# Patient Record
Sex: Female | Born: 2005 | Race: White | Hispanic: No | Marital: Single | Smoking: Never smoker
Health system: Southern US, Community
[De-identification: ages and names within clinical notes are randomized; demographics above are authoritative.]

## PROBLEM LIST (undated history)

## (undated) DIAGNOSIS — J309 Allergic rhinitis, unspecified: Secondary | ICD-10-CM

## (undated) DIAGNOSIS — F32A Depression, unspecified: Secondary | ICD-10-CM

## (undated) DIAGNOSIS — N944 Primary dysmenorrhea: Secondary | ICD-10-CM

## (undated) DIAGNOSIS — F329 Major depressive disorder, single episode, unspecified: Secondary | ICD-10-CM

## (undated) DIAGNOSIS — F99 Mental disorder, not otherwise specified: Secondary | ICD-10-CM

## (undated) DIAGNOSIS — F909 Attention-deficit hyperactivity disorder, unspecified type: Secondary | ICD-10-CM

## (undated) DIAGNOSIS — F419 Anxiety disorder, unspecified: Secondary | ICD-10-CM

## (undated) DIAGNOSIS — J4530 Mild persistent asthma, uncomplicated: Secondary | ICD-10-CM

## (undated) HISTORY — DX: Attention-deficit hyperactivity disorder, unspecified type: F90.9

## (undated) HISTORY — DX: Depression, unspecified: F32.A

## (undated) HISTORY — DX: Anxiety disorder, unspecified: F41.9

## (undated) HISTORY — PX: ADENOIDECTOMY: SUR15

## (undated) HISTORY — DX: Mild persistent asthma, uncomplicated: J45.30

## (undated) HISTORY — DX: Major depressive disorder, single episode, unspecified: F32.9

## (undated) HISTORY — DX: Mental disorder, not otherwise specified: F99

## (undated) HISTORY — DX: Primary dysmenorrhea: N94.4

## (undated) HISTORY — DX: Allergic rhinitis, unspecified: J30.9

---
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
## 2024-03-27 ENCOUNTER — Encounter (HOSPITAL_BASED_OUTPATIENT_CLINIC_OR_DEPARTMENT_OTHER): Payer: Self-pay | Admitting: Emergency Medicine

## 2024-03-27 ENCOUNTER — Ambulatory Visit: Payer: Self-pay | Admitting: *Deleted

## 2024-03-27 ENCOUNTER — Other Ambulatory Visit: Payer: Self-pay

## 2024-03-27 ENCOUNTER — Emergency Department (HOSPITAL_BASED_OUTPATIENT_CLINIC_OR_DEPARTMENT_OTHER)
Admission: EM | Admit: 2024-03-27 | Discharge: 2024-03-27 | Disposition: A | Discharge status: Home / Self Care | Attending: Emergency Medicine | Admitting: Emergency Medicine

## 2024-03-27 DIAGNOSIS — X101XXA Contact with hot food, initial encounter: Secondary | ICD-10-CM | POA: Diagnosis not present

## 2024-03-27 DIAGNOSIS — T22211A Burn of second degree of right forearm, initial encounter: Secondary | ICD-10-CM | POA: Diagnosis not present

## 2024-03-27 DIAGNOSIS — J45909 Unspecified asthma, uncomplicated: Secondary | ICD-10-CM | POA: Diagnosis not present

## 2024-03-27 DIAGNOSIS — T22111A Burn of first degree of right forearm, initial encounter: Secondary | ICD-10-CM | POA: Diagnosis present

## 2024-03-27 DIAGNOSIS — T310 Burns involving less than 10% of body surface: Secondary | ICD-10-CM | POA: Insufficient documentation

## 2024-03-27 DIAGNOSIS — T300 Burn of unspecified body region, unspecified degree: Secondary | ICD-10-CM

## 2024-03-27 MED ORDER — LIDOCAINE VISCOUS HCL 2 % MT SOLN
15.0000 mL | Freq: Once | OROMUCOSAL | Status: AC
  Administered 2024-03-27: 15 mL via TOPICAL

## 2024-03-27 MED ORDER — LIDOCAINE VISCOUS HCL 2 % MT SOLN
15.0000 mL | Freq: Once | OROMUCOSAL | Status: DC
  Filled 2024-03-27: qty 15

## 2024-03-27 MED ORDER — BACITRACIN ZINC 500 UNIT/GM EX OINT: 1.0000 | TOPICAL_OINTMENT | Freq: Two times a day (BID) | CUTANEOUS | 0 refills | Status: DC

## 2024-03-27 MED ORDER — LIDOCAINE 5 % EX OINT: 1.0000 | TOPICAL_OINTMENT | CUTANEOUS | 0 refills | Status: DC | PRN
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
## 2024-03-27 NOTE — ED Notes (Signed)
 Discharge paperwork given and verbally understood.

## 2024-03-27 NOTE — Discharge Instructions (Addendum)
 As discussed, I have sent in numbing ointment to place over the area.  If the area opens up, recommend putting antibiotic ointment on the area for bacitracin.  Recommend follow-up with your primary care for reassessment.  Please do not hesitate to return if the worrisome signs and symptoms we discussed become apparent.

## 2024-03-27 NOTE — Telephone Encounter (Signed)
 ED recommended. Noted. LS
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
## 2024-04-20 ENCOUNTER — Ambulatory Visit: Payer: Self-pay

## 2024-04-20 NOTE — Telephone Encounter (Signed)
Noted  -LS

## 2024-04-20 NOTE — Telephone Encounter (Signed)
 FYI Only or Action Required?: FYI only for provider  Patient was last seen in primary care on 12/25/2023 by Eliodoro Guerin, DO. Called Nurse Triage reporting Sore Throat and Cough. Symptoms began several days ago. Interventions attempted: Prescription medications: Zyrtec . Symptoms are: cough, sore throat, bilateral ear aches, runny nosegradually worsening.  Triage Disposition: See Physician Within 24 Hours  Patient/caregiver understands and will follow disposition?: Yes                  Copied from CRM [id number]. Topic: Clinical - Red Word Triage >> Apr 20, 2024 11:36 AM Tiffany H wrote: Red Word that prompted transfer to Nurse Triage: Patient called to advise that she woke up at her boyfriend's house yesterday with bad throat pains that have gotten worse this morning. Patient now also has a cough. She feels as though someone is running a fork down the top and bottom of her throat.   No fever. Please assist. Reason for Disposition  Earache also present  Answer Assessment - Initial Assessment Questions 1. ONSET: "When did the throat start hurting?" (Hours or days ago)      Yesterday morning.  2. SEVERITY: "How bad is the sore throat?" (Scale 1-10; mild, moderate or severe)   - MILD (1-3):  Doesn't interfere with eating or normal activities.   - MODERATE (4-7): Interferes with eating some solids and normal activities.   - SEVERE (8-10):  Excruciating pain, interferes with most normal activities.   - SEVERE WITH DYSPHAGIA (10): Can't swallow liquids, drooling.     7/10.  3. STREP EXPOSURE: "Has there been any exposure to strep within the past week?" If Yes, ask: "What type of contact occurred?"      Yes, she states there were kids at graduation who had strep throat.   4.  VIRAL SYMPTOMS: "Are there any symptoms of a cold, such as a runny nose, cough, hoarse voice or red eyes?"      Cough (states it kept her up most of the night, last night), runny nose (x past 3  days)  5. FEVER: "Do you have a fever?" If Yes, ask: "What is your temperature, how was it measured, and when did it start?"     No.  6. PUS ON THE TONSILS: "Is there pus on the tonsils in the back of your throat?"     No pus but states there are red bumps on her tonsils.  7. OTHER SYMPTOMS: "Do you have any other symptoms?" (e.g., difficulty breathing, headache, rash)     Headache (2 days ago), bilateral earaches (since last night)  8. PREGNANCY: "Is there any chance you are pregnant?" "When was your last menstrual period?"     LMP: 04/09/24.  Patient denies: rash, SOB.  Protocols used: Sore Throat-A-AH

## 2024-04-21 ENCOUNTER — Ambulatory Visit (INDEPENDENT_AMBULATORY_CARE_PROVIDER_SITE_OTHER): Admitting: Nurse Practitioner

## 2024-04-21 ENCOUNTER — Encounter: Payer: Self-pay | Admitting: Nurse Practitioner

## 2024-04-21 VITALS — BP 127/87 | HR 91 | Temp 98.1°F | Ht 63.0 in | Wt 140.0 lb

## 2024-04-21 DIAGNOSIS — J029 Acute pharyngitis, unspecified: Secondary | ICD-10-CM | POA: Diagnosis not present

## 2024-04-21 LAB — CULTURE, GROUP A STREP

## 2024-04-21 LAB — RAPID STREP SCREEN (MED CTR MEBANE ONLY): Strep Gp A Ag, IA W/Reflex: NEGATIVE

## 2024-04-21 NOTE — Progress Notes (Signed)
   Subjective:    Patient ID: Paige Ayala, female    DOB: 2006-08-17, 18 y.o.   MRN: [id number]  Sore Throat  This is a new problem. The current episode started in the past 7 days. The problem has been waxing and waning. Neither side of throat is experiencing more pain than the other. There has been no fever. The pain is at a severity of 5/10. The pain is moderate. Associated symptoms include congestion, coughing and trouble swallowing. Pertinent negatives include no swollen glands. She has had exposure to strep. She has tried acetaminophen for the symptoms. The treatment provided mild relief.      Review of Systems  Constitutional:  Negative for chills and fever.  HENT:  Positive for congestion and trouble swallowing.   Respiratory:  Positive for cough.        Objective:   Physical Exam HENT:     Right Ear: Tympanic membrane normal.     Left Ear: Tympanic membrane normal.     Nose: Congestion and rhinorrhea present.     Mouth/Throat:     Pharynx: No oropharyngeal exudate or posterior oropharyngeal erythema.  Cardiovascular:     Rate and Rhythm: Normal rate and regular rhythm.     Heart sounds: Normal heart sounds.  Pulmonary:     Effort: Pulmonary effort is normal.     Breath sounds: Normal breath sounds.  Musculoskeletal:     Cervical back: Normal range of motion.  Skin:    General: Skin is warm.  Neurological:     General: No focal deficit present.     Mental Status: She is alert and oriented to person, place, and time.  Psychiatric:        Mood and Affect: Mood normal.        Behavior: Behavior normal.    BP 127/87   Pulse 91   Temp 98.1 F (36.7 C) (Temporal)   Ht 5\' 3"  (1.6 m)   Wt 140 lb (63.5 kg)   SpO2 95%   BMI 24.80 kg/m         Assessment & Plan:  Paige Ayala in today with chief complaint of Ear Pain and Sore Throat   1. Sore throat (Primary)  - Rapid Strep Screen ([facility] ONLY)  2. Viral pharyngitis Force fluids Motrin or  tylenol OTC OTC decongestant Throat lozenges if help New toothbrush in 3 days     The above assessment and management plan was discussed with the patient. The patient verbalized understanding of and has agreed to the management plan. Patient is aware to call the clinic if symptoms persist or worsen. Patient is aware when to return to the clinic for a follow-up visit. Patient educated on when it is appropriate to go to the emergency department.   Mary-Margaret Gaylyn Keas, FNP

## 2024-04-21 NOTE — Patient Instructions (Signed)
 Sore Throat  When you have a sore throat, your throat may feel:  Tender.  Burning.  Irritated.  Scratchy.  Painful when you swallow.  Painful when you talk.  Many things can cause a sore throat, such as:  An infection.  Allergies.  Dry air.  Smoke or pollution.  Radiation treatment for cancer.  Gastroesophageal reflux disease (GERD).  A tumor.  A sore throat can be the first sign of another sickness. It can happen with other problems, like:  Coughing.  Sneezing.  Fever.  Swelling of the glands in the neck.  Most sore throats go away without treatment.  Follow these instructions at home:         Medicines  Take over-the-counter and prescription medicines only as told by your doctor.  Children often get sore throats. Do not give your child aspirin.  Use throat sprays to soothe your throat as told by your health care provider.  Managing pain  To help with pain:  Sip warm liquids, such as broth, herbal tea, or warm water.  Eat or drink cold or frozen liquids, such as frozen ice pops.  Rinse your mouth (gargle) with a salt water mixture 3-4 times a day or as needed.  To make salt water, dissolve -1 tsp (3-6 g) of salt in 1 cup (237 mL) of warm water.  Do not swallow this mixture.  Suck on hard candy or throat lozenges.  Put a cool-mist humidifier in your bedroom at night.  Sit in the bathroom with the door closed for 5-10 minutes while you run hot water in the shower.  General instructions  Do not smoke or use any products that contain nicotine or tobacco. If you need help quitting, ask your doctor.  Get plenty of rest.  Drink enough fluid to keep your pee (urine) pale yellow.  Wash your hands often for at least 20 seconds with soap and water. If soap and water are not available, use hand sanitizer.  Contact a doctor if:  You have a fever for more than 2-3 days.  You keep having symptoms for more than 2-3 days.  Your throat does not get better in 7 days.  You have a fever and your symptoms suddenly get worse.  Your  child who is 3 months to 31 years old has a temperature of 102.71F (39C) or higher.  Get help right away if:  You have trouble breathing.  You cannot swallow fluids, soft foods, or your spit.  You have swelling in your throat or neck that gets worse.  You feel like you may vomit (nauseous) and this feeling lasts a long time.  You cannot stop vomiting.  These symptoms may be an emergency. Get help right away. Call your local emergency services (911 in the [place]  Do not wait to see if the symptoms will go away.  Do not drive yourself to the hospital.  Summary  A sore throat is a painful, burning, irritated, or scratchy throat. Many things can cause a sore throat.  Take over-the-counter medicines only as told by your doctor.  Get plenty of rest.  Drink enough fluid to keep your pee (urine) pale yellow.  Contact a doctor if your symptoms get worse or your sore throat does not get better within 7 days.  This information is not intended to replace advice given to you by your health care provider. Make sure you discuss any questions you have with your health care provider.  Document Revised: 01/25/2021 Document  Reviewed: 01/25/2021  Elsevier Patient Education  2024 [employer].

## 2024-04-23 ENCOUNTER — Ambulatory Visit: Payer: Self-pay | Admitting: Nurse Practitioner

## 2024-05-21 ENCOUNTER — Ambulatory Visit (INDEPENDENT_AMBULATORY_CARE_PROVIDER_SITE_OTHER): Admitting: Nurse Practitioner

## 2024-05-21 ENCOUNTER — Encounter: Payer: Self-pay | Admitting: Nurse Practitioner

## 2024-05-21 VITALS — BP 109/73 | HR 82 | Temp 97.9°F | Ht 63.0 in | Wt 142.0 lb

## 2024-05-21 DIAGNOSIS — L02224 Furuncle of groin: Secondary | ICD-10-CM

## 2024-05-21 NOTE — Patient Instructions (Signed)
 Ingrown Hair  An ingrown hair is a hair that curls and re-enters the skin instead of growing straight out of the skin. An ingrown hair can develop in any part of the skin that hair is removed from. An ingrown hair may cause small pockets of infection. What are the causes? An ingrown hair may be caused by: Shaving. Tweezing. Waxing. Using a hair removal cream. What increases the risk? You are more likely to develop this condition if you have curly hair. What are the signs or symptoms? Symptoms of an ingrown hair may include: Small bumps on the skin. The bumps may be filled with pus. Pain. Itching. How is this diagnosed? An ingrown hair is diagnosed by a skin exam. How is this treated? Treatment is often not needed unless the ingrown hair has caused an infection. If needed, treatment may include: Applying prescription creams to the skin. This can help with inflammation. Applying warm compresses to the skin. This can help soften the skin. Taking antibiotic medicine. An antibiotic may be prescribed if the infection is severe. Retracting and releasing the ingrown hair tips. Hair removal by electrolysis or laser. Follow these instructions at home: Medicines Take, apply, or use over-the-counter and prescription medicines only as told by your health care provider. This includes any prescription creams. If you were prescribed an antibiotic medicine, take it as told by your health care provider. Do not stop using the antibiotic even if you start to feel better. General instructions Do not shave irritated areas of skin. You may start shaving these areas again once the irritation has gone away. To help remove ingrown hairs on your face, you may use a facial sponge in a gentle circular motion. Do not pick or squeeze the irritated area of skin as this may cause infection and scarring. Use a hair removal cream as told by your health care provider. Managing pain and swelling  If directed, apply  heat to the affected area as often as told by your health care provider. Use the heat source that your health care provider recommends, such as a moist heat pack or a heating pad. Place a towel between your skin and the heat source. Leave the heat on for 20-30 minutes. If your skin turns bright red, remove the heat right away to prevent burns. The risk of burns is higher if you cannot feel pain, heat, or cold. How is this prevented? Shower before shaving. Wrap areas that you are going to shave in warm, moist wraps for several minutes before shaving. The warmth and moisture help to soften the hairs and makes ingrown hairs less likely. Use thick shaving gels. Use a razor that cuts hair slightly above your skin, or use an electric shaver with a long shave setting. Shave in the direction of hair growth. Avoid making multiple razor strokes. Apply a moisturizing lotion after shaving. Summary An ingrown hair is a hair that curls and re-enters the skin instead of growing straight out of the skin. Treatment is often not needed unless the ingrown hair has caused an infection. Take, apply, or use over-the-counter and prescription medicines only as told by your health care provider. This includes any prescription creams. Do not shave irritated areas of skin. You may start shaving these areas again once the irritation has gone away. If directed, apply heat to the affected area. Use the heat source that your health care provider recommends, such as a moist heat pack or a heating pad. This information is not intended to replace  advice given to you by your health care provider. Make sure you discuss any questions you have with your health care provider. Document Revised: 12/20/2021 Document Reviewed: 12/20/2021 Elsevier Patient Education  2024 [employer].

## 2024-05-21 NOTE — Progress Notes (Signed)
   Subjective:    Patient ID: Paige Ayala, female    DOB: 07-24-2006, 18 y.o.   MRN: [id number]   Chief Complaint: mass in genital area  HPI  Patient had a sore place in right groin area for about 2 weeks. Popped last night and is much better. Wants to see GYN for PAP.  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  Eyes:  Negative for pain.  Respiratory:  Negative for shortness of breath.   Cardiovascular:  Negative for chest pain, palpitations and leg swelling.  Gastrointestinal:  Negative for abdominal pain.  Endocrine: Negative for polydipsia.  Skin:  Negative for rash.  Neurological:  Negative for dizziness, weakness and headaches.  Hematological:  Does not bruise/bleed easily.  All other systems reviewed and are negative.      Objective:   Physical Exam Constitutional:      Appearance: Normal appearance.  Cardiovascular:     Rate and Rhythm: Normal rate.     Heart sounds: Normal heart sounds.  Pulmonary:     Effort: Pulmonary effort is normal.     Breath sounds: Normal breath sounds.  Skin:    General: Skin is warm.     Comments: Small erythematous nontender lesion right groin  Neurological:     General: No focal deficit present.     Mental Status: She is alert and oriented to person, place, and time.  Psychiatric:        Mood and Affect: Mood normal.        Behavior: Behavior normal.     BP 109/73   Pulse 82   Temp 97.9 F (36.6 C) (Temporal)   Ht 5' 3 (1.6 m)   Wt 142 lb (64.4 kg)   SpO2 97%   BMI 25.15 kg/m        Assessment & Plan:    Paige Ayala in today with chief complaint of Red are in vaginal area (Popped last night/)   1. Furuncle of groin (Primary) No antibiotic needed Shave in direction that hair grows Does not need PAP until turn 20 RTO prn    The above assessment and management plan was discussed with the patient. The patient verbalized understanding of and has agreed to the management plan. Patient is aware to call the clinic if symptoms persist or worsen. Patient is aware when to return to the clinic for a follow-up visit. Patient educated on when it is appropriate to go to the emergency department.   Mary-Margaret Gladis, FNP

## 2024-08-26 ENCOUNTER — Ambulatory Visit (INDEPENDENT_AMBULATORY_CARE_PROVIDER_SITE_OTHER): Admitting: Family Medicine

## 2024-08-26 ENCOUNTER — Encounter: Payer: Self-pay | Admitting: Family Medicine

## 2024-08-26 ENCOUNTER — Telehealth: Payer: Self-pay

## 2024-08-26 VITALS — BP 112/74 | HR 100 | Temp 97.8°F | Ht 63.0 in | Wt 139.4 lb

## 2024-08-26 DIAGNOSIS — Z1239 Encounter for other screening for malignant neoplasm of breast: Secondary | ICD-10-CM
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
## 2024-08-26 NOTE — Telephone Encounter (Signed)
 Okay to order?   Copied from CRM #[id number]. Topic: Clinical - Request for Lab/Test Order >> Aug 26, 2024 11:26 AM Paige Ayala wrote: Reason for CRM: Patient was seen today in the office and is requesting an order for a mammogram. States would like to have for her own piece of mind.  Paige Ayala can be reached at [phone]

## 2024-08-26 NOTE — Telephone Encounter (Signed)
 Insurance will not cover a mammogram at her age as her breast exam was normal.

## 2024-08-26 NOTE — Telephone Encounter (Signed)
Pt aware of provider feedback and voiced understanding. 

## 2024-10-23 ENCOUNTER — Encounter: Payer: Self-pay | Admitting: Family Medicine

## 2024-10-23 ENCOUNTER — Ambulatory Visit: Admitting: Family Medicine

## 2024-10-23 VITALS — BP 138/88 | HR 95 | Temp 97.9°F | Ht 63.0 in | Wt 141.2 lb

## 2024-10-23 DIAGNOSIS — F331 Major depressive disorder, recurrent, moderate: Secondary | ICD-10-CM

## 2024-10-23 DIAGNOSIS — R6889 Other general symptoms and signs: Secondary | ICD-10-CM | POA: Diagnosis not present

## 2024-10-23 LAB — VERITOR SARS-COV-2 AND FLU A+B
BD Veritor SARS-CoV-2 Ag: NEGATIVE
Influenza A: NEGATIVE
Influenza B: NEGATIVE

## 2024-10-23 LAB — RSV AG, IMMUNOCHR, WAIVED: RSV Ag, Immunochr, Waived: NEGATIVE

## 2024-10-23 MED ORDER — BUSPIRONE HCL 15 MG PO TABS: 15.0000 mg | ORAL_TABLET | Freq: Two times a day (BID) | ORAL | 1 refills | Status: AC

## 2024-10-23 MED ORDER — BENZONATATE 100 MG PO CAPS: 100.0000 mg | ORAL_CAPSULE | Freq: Two times a day (BID) | ORAL | 0 refills | Status: AC | PRN

## 2024-10-23 MED ORDER — FLUOXETINE HCL 40 MG PO CAPS: 40.0000 mg | ORAL_CAPSULE | Freq: Every day | ORAL | 0 refills | Status: AC
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
## 2024-10-23 NOTE — Patient Instructions (Signed)
It appears that you have a viral upper respiratory infection (cold).  Cold symptoms can last up to 2 weeks.   ? ?- Get plenty of rest and drink plenty of fluids. ?- Try to breathe moist air. Use a cold mist humidifier. ?- Consume warm fluids (soup or tea) to provide relief for a stuffy nose and to loosen phlegm. ?- For nasal stuffiness, try saline nasal spray or a Neti Pot. Afrin nasal spray can also be used but this product should not be used longer than 3 days or it will cause rebound nasal stuffiness (worsening nasal congestion). ?- For sore throat pain relief: use chloraseptic spray, suck on throat lozenges, hard candy or popsicles; gargle with warm salt water (1/4 tsp. salt per 8 oz. of water); and eat soft, bland foods. ?- Eat a well-balanced diet. If you cannot, ensure you are getting enough nutrients by taking a daily multivitamin. ?- Avoid dairy products, as they can thicken phlegm. ?- Avoid alcohol, as it impairs your body?s immune system. ? ?CONTACT YOUR DOCTOR IF YOU EXPERIENCE ANY OF THE FOLLOWING: ?- High fever ?- Ear pain ?- Sinus-type headache ?- Unusually severe cold symptoms ?- Cough that gets worse while other cold symptoms improve ?- Flare up of any chronic lung problem, such as asthma ?- Your symptoms persist longer than 2 weeks ? ? ?

## 2024-11-11 DIAGNOSIS — F3341 Major depressive disorder, recurrent, in partial remission: Secondary | ICD-10-CM | POA: Diagnosis not present

## 2024-11-11 DIAGNOSIS — F411 Generalized anxiety disorder: Secondary | ICD-10-CM | POA: Diagnosis not present

## 2024-12-07 ENCOUNTER — Ambulatory Visit: Admitting: Family Medicine

## 2024-12-08 ENCOUNTER — Ambulatory Visit: Admitting: Family Medicine

## 2024-12-14 ENCOUNTER — Other Ambulatory Visit: Payer: Self-pay | Admitting: Family Medicine

## 2024-12-14 DIAGNOSIS — Z3041 Encounter for surveillance of contraceptive pills: Secondary | ICD-10-CM

## 2025-01-05 ENCOUNTER — Encounter: Payer: Medicaid Other | Admitting: Family Medicine
